# Patient Record
Sex: Female | Born: 1950 | Race: Black or African American | Hispanic: No | Marital: Married
Health system: Southern US, Community
[De-identification: ages and names within clinical notes are randomized; demographics above are authoritative.]

---

## 2002-08-08 ENCOUNTER — Ambulatory Visit (HOSPITAL_COMMUNITY): Admission: RE | Admit: 2002-08-08 | Discharge: 2002-08-08 | Payer: Self-pay

## 2011-11-10 ENCOUNTER — Other Ambulatory Visit: Payer: Self-pay | Admitting: Internal Medicine

## 2017-01-10 ENCOUNTER — Ambulatory Visit: Payer: Medicare HMO | Attending: Radiation Oncology | Admitting: Physical Therapy

## 2017-01-10 DIAGNOSIS — I890 Lymphedema, not elsewhere classified: Secondary | ICD-10-CM | POA: Diagnosis not present

## 2017-01-11 NOTE — Therapy (Signed)
[facility] [address] [place], [place], [address] Phone: [phone]   Fax:  [phone]  Physical Therapy Evaluation  Patient Details  Name: Tina Carroll MRN: [id number] Date of Birth: 1950-12-01 Referring Provider: Dr. Dorothyann Gibbs  Encounter Date: 01/10/2017      PT End of Session - 01/11/17 0928    Visit Number 1   Number of Visits 1   PT Start Time 1435   PT Stop Time 1522   PT Time Calculation (min) 47 min   Activity Tolerance Patient tolerated treatment well   Behavior During Therapy [facility] for tasks assessed/performed      No past medical history on file.  No past surgical history on file.  There were no vitals filed for this visit.       Subjective Assessment - 01/10/17 1436    Subjective try to get an arm sleeve for my arm; try to control the swelling and numbness; did learn some manual lymph drainage several years ago; sometimes the arm is so tight and irritating. Reports fingertip numbness and sometimes difficulty handling objects with the right hand.   Patient is accompained by: Family member  sister   Pertinent History Left breast cancer in 2007 with chemo, radiation, and lumpectomy with SLNB; right breast cancer 2014 with 12 lymph nodes removed with chemo and radiation.  Diabetes, HTN controlled with meds.    Patient Stated Goals get arm sleeve, control swelling   Currently in Pain? No/denies  right arm kind of tight, but not hurting               LYMPHEDEMA/ONCOLOGY QUESTIONNAIRE - 01/11/17 0938      Type   Cancer Type left breast 2007, right 2014     Treatment   Past Chemotherapy Treatment Yes   Past Radiation Treatment Yes     What other symptoms do you have   Are you Having Heaviness or Tightness Yes   Are you having pitting edema Yes  and induration   Body Site right arm   Stemmer Sign No     Lymphedema Assessments   Lymphedema Assessments Upper extremities     Right Upper Extremity Lymphedema   15 cm Proximal to Olecranon Process 35.2 cm   10 cm Proximal to Olecranon Process 34.7 cm   Olecranon Process 30.9 cm   15 cm Proximal to Ulnar Styloid Process 32.2 cm   10 cm Proximal to Ulnar Styloid Process 28.2 cm   Just Proximal to Ulnar Styloid Process 19.3 cm   Across Hand at [facility] 21 cm   At [place] of 2nd Digit 6.4 cm     Left Upper Extremity Lymphedema   15 cm Proximal to Olecranon Process 31.8 cm   10 cm Proximal to Olecranon Process 30.2 cm   Olecranon Process 25.4 cm   15 cm Proximal to Ulnar Styloid Process 25.2 cm   10 cm Proximal to Ulnar Styloid Process 22.3 cm   Just Proximal to Ulnar Styloid Process 16.5 cm   Across Hand at [facility] 19 cm                        PT Education - 01/11/17 4010    Education provided Yes   Education Details where and how to obtain a compression sleeve; lymphedema pamphlet from [facility]; ABC class handout; about risk of infection, how to identify infection, and what to do  Person(s) Educated Patient;Other (comment)  sister   Methods Explanation;Handout   Comprehension Verbalized understanding                Long Term Clinic Goals - 01/11/17 0935      CC Long Term Goal  #1   Title Patient will be knowledgeable about risk, signs, and treatment for cellulitis.   Status Achieved     CC Long Term Goal  #2   Title Patient will be knowledgeable about where and how to obtain a day and possibly a nighttime compression garment.   Status Achieved     CC Long Term Goal  #3   Title Pt. will have been given educational information about lymphedema.   Status Achieved            Plan - 01/11/17 0929    Clinical Impression Statement This is a very pleasant woman s/p breast cancer episodes in both sides and with right UE lymphedema.  She has received brief treatment in the past for this and now wants to get new compression garments.  Evaluation  revealed that patient has a significant amount of swelling in right UE, as much as a 7 cm. difference right to left at some levels of her arm.  She has some knowledge of self-care, such as doing manual lymph drainage, but did not have knowledge of her risk of infection nor of the availability of a nighttime compression garment. Her lymphedema life impact scale score indicates a 65% impairment from her lymphedema.  Because of all this, the patient was encouraged to consider a course of complete decongestive therapy to reduce her arm swelling before getting new garments, but she chooses not to do this.  We do not issue sleeves here, but she has information about where to get one, and was encouraged to ask about a nighttime compression garment as well.  She was also given information about infection and risk reduction practices.  Evaluation is of low complexity today.   Rehab Potential Good   PT Frequency One time visit  by patient's choice   PT Treatment/Interventions Patient/family education   PT Next Visit Plan None planned.  Patient declines further therapy.   Recommended Other Services Obtaining day- and nighttime compression garments.   Consulted and Agree with Plan of Care Patient      Patient will benefit from skilled therapeutic intervention in order to improve the following deficits and impairments:  Increased edema, Decreased knowledge of precautions, Decreased knowledge of use of DME  Visit Diagnosis: Lymphedema, not elsewhere classified - Plan: PT plan of care cert/re-cert      G-Codes - 01/11/17 0936    Functional Assessment Tool Used (Outpatient Only) lymphedema life impact scale   Functional Limitation Self care   Self Care Current Status ([id number]) At least 60 percent but less than 80 percent impaired, limited or restricted   Self Care Goal Status ([id number](G8988) At least 40 percent but less than 60 percent impaired, limited or restricted   Self Care Discharge Status [phone]) At least 60  percent but less than 80 percent impaired, limited or restricted       Problem List There are no active problems to display for this patient.   Tina Carroll 01/11/2017, 9:42 AM  [facility] [address] [place], [place], [address] Phone: [phone]   Fax:  [phone]  Name: Tina HarderBetty Carroll MRN: [id number] Date of Birth: 08-Jun-1951   Tina Carroll, PT 01/11/17 9:43 AM

## 2018-08-16 ENCOUNTER — Other Ambulatory Visit: Payer: Self-pay | Admitting: Internal Medicine

## 2018-08-16 DIAGNOSIS — R921 Mammographic calcification found on diagnostic imaging of breast: Secondary | ICD-10-CM

## 2018-08-27 ENCOUNTER — Ambulatory Visit
Admission: RE | Admit: 2018-08-27 | Discharge: 2018-08-27 | Disposition: A | Discharge status: Home / Self Care | Payer: Medicare HMO | Source: Ambulatory Visit | Attending: Internal Medicine | Admitting: Internal Medicine

## 2018-08-27 DIAGNOSIS — R921 Mammographic calcification found on diagnostic imaging of breast: Secondary | ICD-10-CM

## 2020-03-17 IMAGING — MG STEREOTACTIC VACUUM ASSIST RIGHT
7 series · 8 of 15 positions shown · non-contrast
Comparison: Previous exams.

ADDENDUM:
Pathology revealed FIBROADENOMA, MICROCALCIFICATIONS, of RIGHT
breast, upper outer. This was found to be concordant by Dr. Bagiyal
Bruyneel.

Pathology results were discussed with Blain Jumper, Breast
Navigator of [REDACTED], [REDACTED], [REDACTED] who will discuss the biopsy results with the patient by
telephone.
The patient was instructed to return for annual screening
mammography and informed a reminder notice would be sent regarding
this appointment.
Pathology and imaging reports will be faxed to [facility] [place].
Pathology results reported by Cimino, RN on 08/28/2018.
CLINICAL DATA: Patient with indeterminate right breast
calcifications.
EXAM:
RIGHT BREAST STEREOTACTIC CORE NEEDLE BIOPSY

[R (1 of 4)]
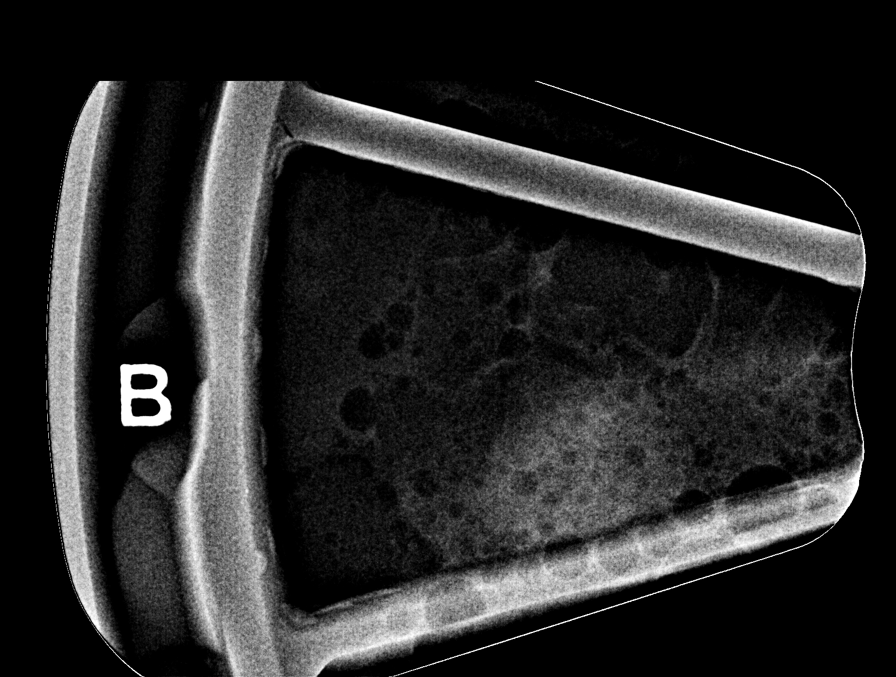

[R (2 of 4)]
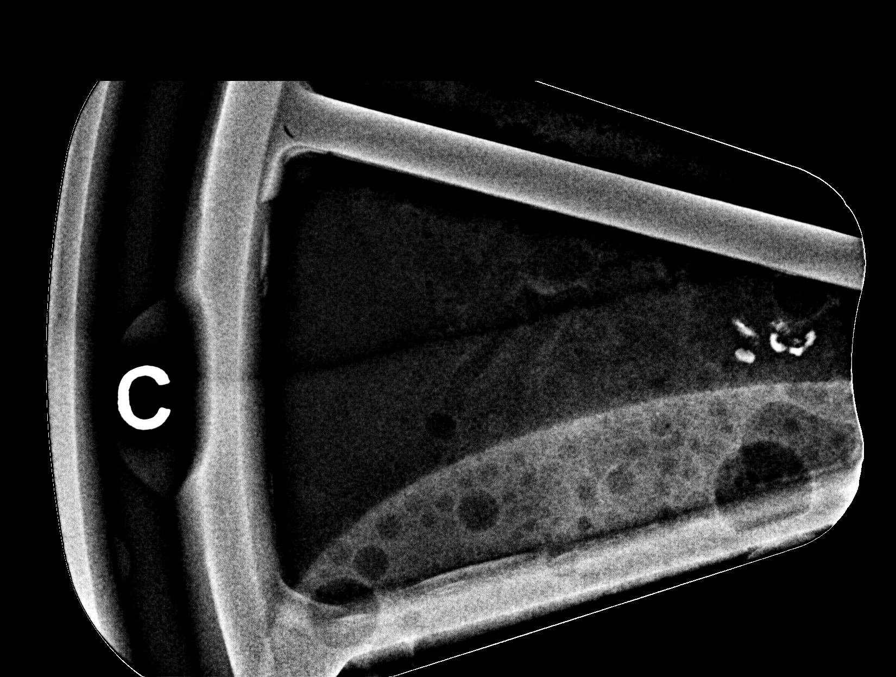

[R (3 of 4)]
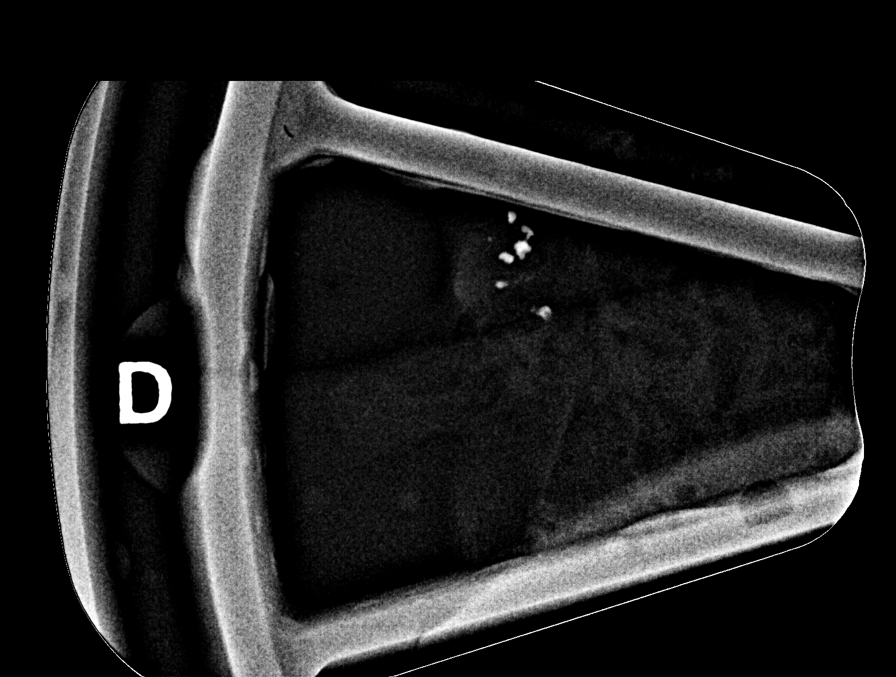

[R (4 of 4)]
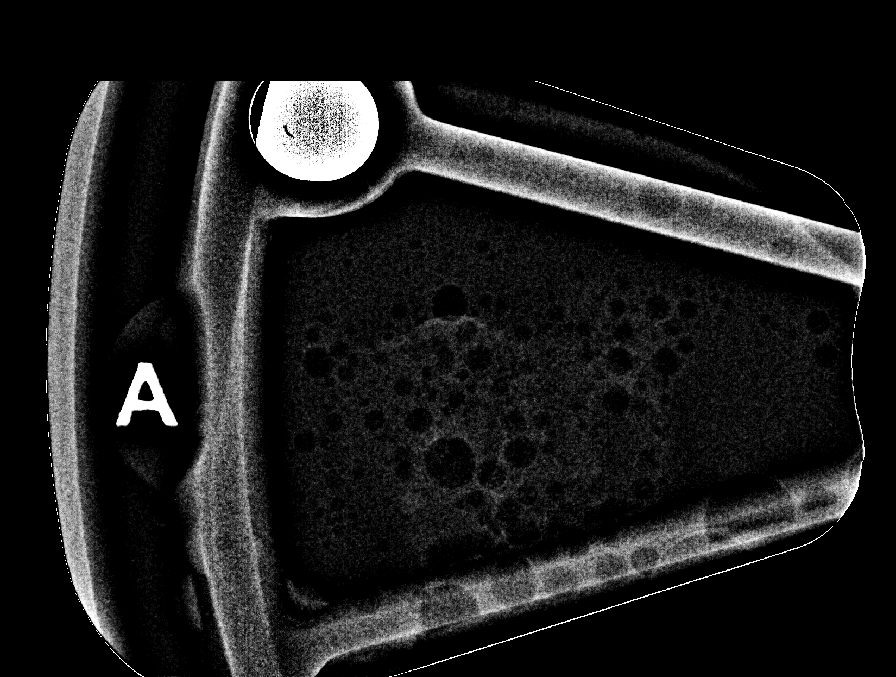

[R CC]
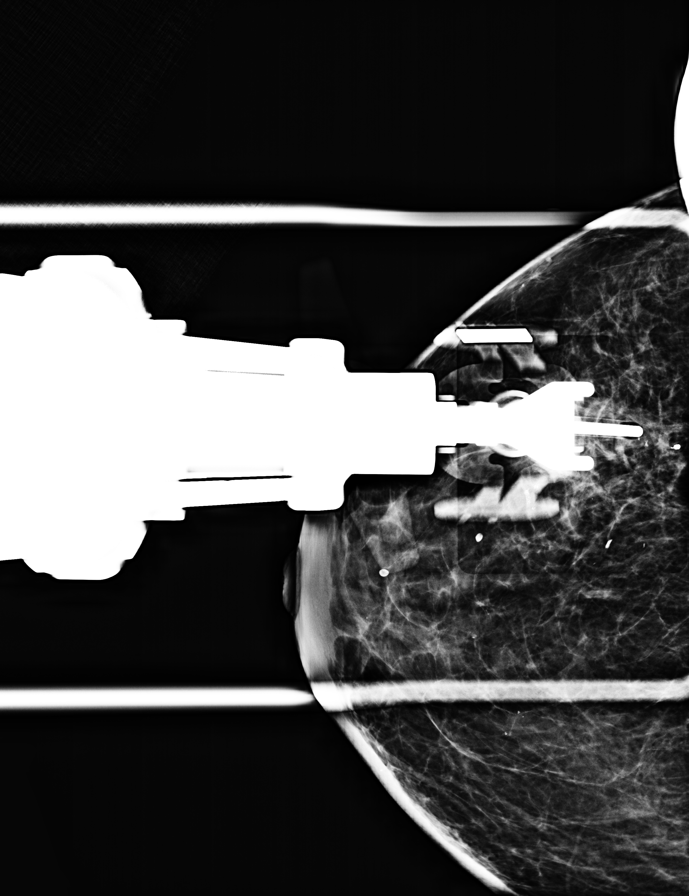

[R CC tomo · 2 of 68 frames shown (1 of 2)]
[frame 22/68]
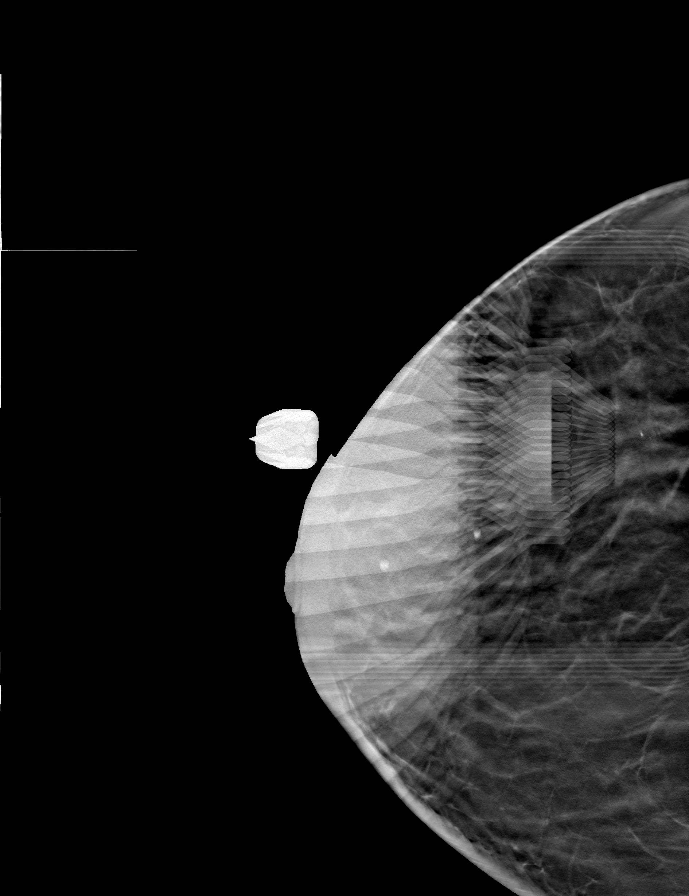
[frame 35/68]
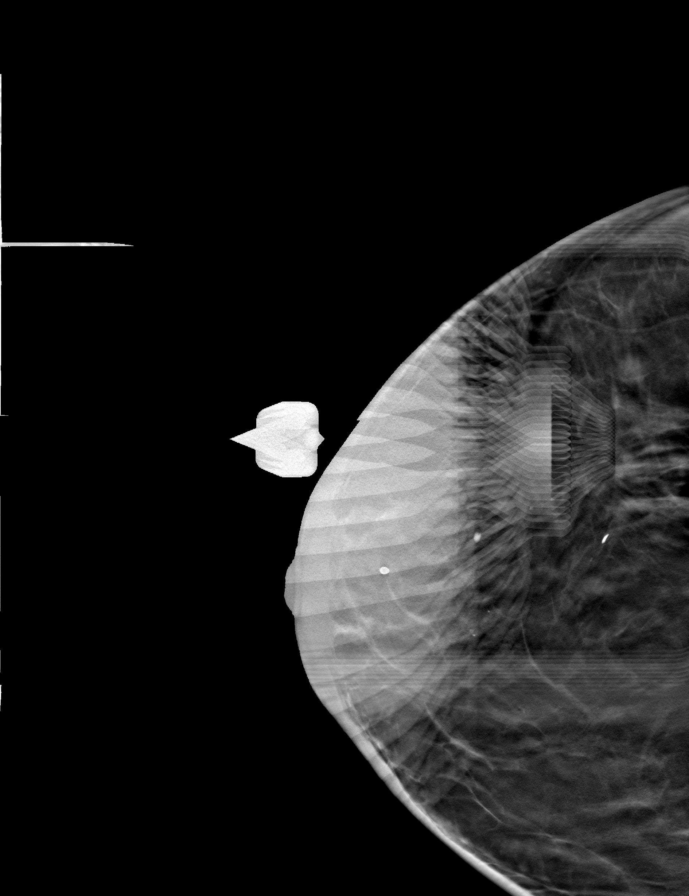

[R CC tomo (2 of 2) · tomo slice 35/69.0]
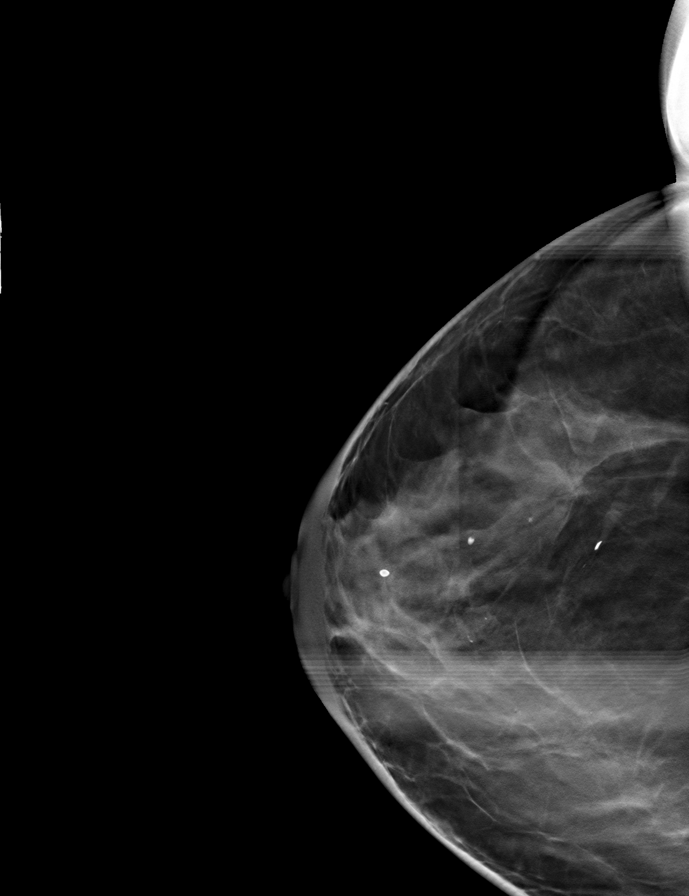

[8 of 15 positions shown; findings below may reference images not displayed]



Using sterile technique and 1% Lidocaine as local anesthetic, under
stereotactic guidance, a 9 gauge vacuum assisted device was used to
perform core needle biopsy of calcifications within the upper-outer
right breast using a cranial approach. Specimen radiograph was
performed showing calcification. Specimens with calcifications are
identified for pathology.

Lesion quadrant: Upper outer quadrant

At the conclusion of the procedure, a coil shaped tissue marker clip
was deployed into the biopsy cavity. Follow-up 2-view mammogram was
performed and dictated separately.
IMPRESSION: Stereotactic-guided biopsy of right breast calcifications. No
apparent complications.
